# Patient Record
Sex: Female | Born: 2000 | Race: White | Hispanic: No | Marital: Single | Smoking: Never smoker
Health system: Southern US, Community
[De-identification: ages and names within clinical notes are randomized; demographics above are authoritative.]

## PROBLEM LIST (undated history)

## (undated) DIAGNOSIS — Z8669 Personal history of other diseases of the nervous system and sense organs: Secondary | ICD-10-CM

## (undated) DIAGNOSIS — F418 Other specified anxiety disorders: Secondary | ICD-10-CM

## (undated) DIAGNOSIS — Z950 Presence of cardiac pacemaker: Secondary | ICD-10-CM

## (undated) DIAGNOSIS — I469 Cardiac arrest, cause unspecified: Secondary | ICD-10-CM

## (undated) HISTORY — DX: Personal history of other diseases of the nervous system and sense organs: Z86.69

## (undated) HISTORY — PX: CARDIAC DEFIBRILLATOR PLACEMENT: SHX171

## (undated) HISTORY — DX: Presence of cardiac pacemaker: Z95.0

## (undated) HISTORY — DX: Other specified anxiety disorders: F41.8

## (undated) HISTORY — PX: ADENOIDECTOMY: SUR15

---
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
## 2022-02-12 ENCOUNTER — Other Ambulatory Visit: Payer: Self-pay

## 2022-02-12 ENCOUNTER — Ambulatory Visit (INDEPENDENT_AMBULATORY_CARE_PROVIDER_SITE_OTHER): Payer: Medicaid Other | Admitting: Internal Medicine

## 2022-02-12 ENCOUNTER — Encounter: Payer: Self-pay | Admitting: Internal Medicine

## 2022-02-12 VITALS — BP 114/71 | HR 60 | Ht 68.0 in | Wt 206.7 lb

## 2022-02-12 DIAGNOSIS — I469 Cardiac arrest, cause unspecified: Secondary | ICD-10-CM

## 2022-02-12 DIAGNOSIS — M545 Low back pain, unspecified: Secondary | ICD-10-CM

## 2022-02-12 DIAGNOSIS — F418 Other specified anxiety disorders: Secondary | ICD-10-CM

## 2022-02-12 DIAGNOSIS — Z95810 Presence of automatic (implantable) cardiac defibrillator: Secondary | ICD-10-CM | POA: Diagnosis not present

## 2022-02-12 NOTE — Progress Notes (Signed)
? ?New Patient Office Visit ? ?Subjective:  ?Patient ID: Donna Sims, female    DOB: October 18, 2001  Age: 21 y.o. MRN: [id number] ? ?CC:  ?Chief Complaint  ?Patient presents with  ? New Patient (Initial Visit)  ? ? ?Back Pain ?This is a new problem. The current episode started in the past 7 days. The problem occurs every several days. The pain is present in the sacro-iliac. The quality of the pain is described as burning and aching. The pain does not radiate. The pain is at a severity of 8/10. The pain is moderate. The symptoms are aggravated by bending. Associated symptoms include headaches and pelvic pain. Pertinent negatives include no bladder incontinence, bowel incontinence, chest pain, dysuria or leg pain.  ?Patient presents for new pt, c/o lower back pain ? ?Past Medical History:  ?Diagnosis Date  ? Anxiety associated with depression   ? Cardiac arrest [facility])   ? Hx of migraines   ? Pacemaker   ? ? ? ?Current Outpatient Medications:  ?  cetirizine (ZYRTEC) 10 MG tablet, Take 10 mg by mouth daily., Disp: , Rfl:  ?  nadolol (CORGARD) 20 MG tablet, Take 20 mg by mouth daily., Disp: , Rfl:   ? ?Past Surgical History:  ?Procedure Laterality Date  ? ADENOIDECTOMY    ? CARDIAC DEFIBRILLATOR PLACEMENT    ? ? ?Family History  ?Problem Relation Age of Onset  ? Migraines Mother   ? Asthma Mother   ? Anxiety disorder Mother   ? Depression Mother   ? Heart disease Mother   ? Depression Father   ? Anxiety disorder Father   ? Heart disease Brother   ? Asthma Brother   ? ? ?Social History  ? ?Socioeconomic History  ? Marital status: Single  ?  Spouse name: Not on file  ? Number of children: 0  ? Years of education: Not on file  ? Highest education level: Not on file  ?Occupational History  ? Not on file  ?Tobacco Use  ? Smoking status: Never  ? Smokeless tobacco: Not on file  ?Substance and Sexual Activity  ? Alcohol use: Yes  ?  Comment: occasionally  ? Drug use: Never  ? Sexual activity: Yes  ?  Partners: Male  ?  Birth  control/protection: Condom  ?Other Topics Concern  ? Not on file  ?Social History Narrative  ? Not on file  ? ?Social Determinants of Health  ? ?Financial Resource Strain: Not on file  ?Food Insecurity: Not on file  ?Transportation Needs: Not on file  ?Physical Activity: Not on file  ?Stress: Not on file  ?Social Connections: Not on file  ?Intimate Partner Violence: Not on file  ? ? ?ROS ?Review of Systems  ?Constitutional: Negative.   ?HENT: Negative.    ?Eyes: Negative.   ?Respiratory: Negative.    ?Cardiovascular: Negative.  Negative for chest pain.  ?     Pacer on  lt side/ defib  ?Gastrointestinal: Negative.  Negative for bowel incontinence.  ?Endocrine: Negative.   ?Genitourinary:  Positive for pelvic pain. Negative for bladder incontinence and dysuria.  ?Musculoskeletal:  Positive for back pain.  ?Skin: Negative.   ?Allergic/Immunologic: Negative.   ?Neurological:  Positive for headaches.  ?Hematological: Negative.   ?Psychiatric/Behavioral: Negative.    ?All other systems reviewed and are negative. ? ?Objective:  ? ?Today's Vitals: BP 114/71   Pulse 60   Ht 5\' 8"  (1.727 m)   Wt 206 lb 11.2 oz (93.8 kg)  BMI 31.43 kg/m?  ? ?Physical Exam ?Constitutional:   ?   Appearance: Normal appearance.  ?HENT:  ?   Head: Normocephalic.  ?   Nose: Nose normal.  ?   Mouth/Throat:  ?   Mouth: Mucous membranes are moist.  ?Cardiovascular:  ?   Rate and Rhythm: Normal rate and regular rhythm.  ?   Heart sounds: No murmur heard. ?Abdominal:  ?   Palpations: Abdomen is soft.  ?Musculoskeletal:  ?   Cervical back: Normal range of motion.  ?   Comments: Tenderness in lower back  ?Neurological:  ?   Mental Status: She is alert.  ? ? ?Assessment & Plan:  ? ?Problem List Items Addressed This Visit   ? ?  ? Cardiovascular and Mediastinum  ? Cardiac arrest [facility])  ?   defibrillator was inserted at [facility] in 2013 ?  ?  ?  ? Other  ? Anxiety associated with depression - Primary  ?  Stable at the present time ?  ?  ? ICD  (implantable cardioverter-defibrillator) in place  ? Acute bilateral low back pain without sciatica  ?  No sciatica ?- Encouraged the patient to stretch or do yoga as able to help with back pain ?Refer to orthopedic surgeon if not better in 1 week ?  ?  ? ? ?Outpatient Encounter Medications as of 02/12/2022  ?Medication Sig  ? cetirizine (ZYRTEC) 10 MG tablet Take 10 mg by mouth daily.  ? nadolol (CORGARD) 20 MG tablet Take 20 mg by mouth daily.  ? ?No facility-administered encounter medications on file as of 02/12/2022.  ? ? ?Follow-up: No follow-ups on file.  ? ?Corky Downs, MD ?

## 2022-02-12 NOTE — Assessment & Plan Note (Signed)
defibrillator was inserted at [facility] in 2013 ?

## 2022-02-12 NOTE — Assessment & Plan Note (Signed)
Stable at the present time. 

## 2022-02-12 NOTE — Assessment & Plan Note (Signed)
No sciatica ?- Encouraged the patient to stretch or do yoga as able to help with back pain ?Refer to orthopedic surgeon if not better in 1 week ?

## 2022-04-18 ENCOUNTER — Ambulatory Visit: Payer: Medicaid Other | Admitting: Nurse Practitioner
